# Patient Record
Sex: Female | Born: 1985
Health system: Southern US, Community
[De-identification: ages and names within clinical notes are randomized; demographics above are authoritative.]

## PROBLEM LIST (undated history)

## (undated) DIAGNOSIS — J45909 Unspecified asthma, uncomplicated: Secondary | ICD-10-CM

---

## 2018-06-14 ENCOUNTER — Emergency Department
Admission: EM | Admit: 2018-06-14 | Discharge: 2018-06-15 | Disposition: A | Discharge status: Home / Self Care | Payer: BC Managed Care – PPO | Attending: Obstetrics and Gynecology | Admitting: Obstetrics and Gynecology

## 2018-06-14 ENCOUNTER — Encounter: Payer: Self-pay | Admitting: Emergency Medicine

## 2018-06-14 ENCOUNTER — Other Ambulatory Visit: Payer: Self-pay

## 2018-06-14 DIAGNOSIS — R102 Pelvic and perineal pain: Secondary | ICD-10-CM | POA: Insufficient documentation

## 2018-06-14 DIAGNOSIS — E876 Hypokalemia: Secondary | ICD-10-CM | POA: Diagnosis not present

## 2018-06-14 DIAGNOSIS — N9489 Other specified conditions associated with female genital organs and menstrual cycle: Secondary | ICD-10-CM | POA: Diagnosis present

## 2018-06-14 DIAGNOSIS — N838 Other noninflammatory disorders of ovary, fallopian tube and broad ligament: Secondary | ICD-10-CM | POA: Insufficient documentation

## 2018-06-14 DIAGNOSIS — N83512 Torsion of left ovary and ovarian pedicle: Secondary | ICD-10-CM | POA: Insufficient documentation

## 2018-06-14 LAB — COMPREHENSIVE METABOLIC PANEL
ALT: 14 U/L (ref 0–44)
AST: 21 U/L (ref 15–41)
Albumin: 4.2 g/dL (ref 3.5–5.0)
Alkaline Phosphatase: 35 U/L — ABNORMAL LOW (ref 38–126)
Anion gap: 13 (ref 5–15)
BUN: 12 mg/dL (ref 6–20)
CO2: 17 mmol/L — ABNORMAL LOW (ref 22–32)
Calcium: 9.2 mg/dL (ref 8.9–10.3)
Chloride: 106 mmol/L (ref 98–111)
Creatinine, Ser: 0.78 mg/dL (ref 0.44–1.00)
GFR calc Af Amer: >60 mL/min (ref >60)
GFR calc non Af Amer: >60 mL/min (ref >60)
Glucose, Bld: 162 mg/dL — ABNORMAL HIGH (ref 70–99)
Potassium: 2.9 mmol/L — ABNORMAL LOW (ref 3.5–5.1)
Sodium: 136 mmol/L (ref 135–145)
Total Bilirubin: 0.7 mg/dL (ref 0.3–1.2)
Total Protein: 7.6 g/dL (ref 6.5–8.1)

## 2018-06-14 LAB — URINALYSIS, COMPLETE (UACMP) WITH MICROSCOPIC
BILIRUBIN URINE: NEGATIVE
Glucose, UA: NEGATIVE mg/dL
Ketones, ur: 20 mg/dL — AB
Leukocytes,Ua: NEGATIVE
Nitrite: NEGATIVE
Protein, ur: NEGATIVE mg/dL
Specific Gravity, Urine: 1.014 (ref 1.005–1.030)
pH: 9 — ABNORMAL HIGH (ref 5.0–8.0)

## 2018-06-14 LAB — CBC
HCT: 40.4 % (ref 36.0–46.0)
Hemoglobin: 13.9 g/dL (ref 12.0–15.0)
MCH: 29.4 pg (ref 26.0–34.0)
MCHC: 34.4 g/dL (ref 30.0–36.0)
MCV: 85.6 fL (ref 80.0–100.0)
NRBC: 0 % (ref 0.0–0.2)
Platelets: 334 10*3/uL (ref 150–400)
RBC: 4.72 MIL/uL (ref 3.87–5.11)
RDW: 12.1 % (ref 11.5–15.5)
WBC: 8.9 10*3/uL (ref 4.0–10.5)

## 2018-06-14 LAB — LIPASE, BLOOD: Lipase: 35 U/L (ref 11–51)

## 2018-06-14 LAB — POCT PREGNANCY, URINE: Preg Test, Ur: NEGATIVE

## 2018-06-14 MED ORDER — ONDANSETRON 4 MG PO TBDP
4.0000 mg | ORAL_TABLET | Freq: Once | ORAL | Status: AC
  Administered 2018-06-14: 4 mg via ORAL
  Filled 2018-06-14: qty 1

## 2018-06-14 MED ORDER — MORPHINE SULFATE (PF) 4 MG/ML IV SOLN
4.0000 mg | Freq: Once | INTRAVENOUS | Status: AC
  Administered 2018-06-15: 4 mg via INTRAVENOUS

## 2018-06-14 MED ORDER — ONDANSETRON HCL 4 MG/2ML IJ SOLN
4.0000 mg | Freq: Once | INTRAMUSCULAR | Status: AC
  Administered 2018-06-15: 4 mg via INTRAVENOUS

## 2018-06-14 MED ORDER — OXYCODONE-ACETAMINOPHEN 5-325 MG PO TABS
1.0000 | ORAL_TABLET | Freq: Once | ORAL | Status: AC
  Administered 2018-06-14: 1 via ORAL
  Filled 2018-06-14: qty 1

## 2018-06-14 MED ORDER — SODIUM CHLORIDE 0.9% FLUSH
3.0000 mL | Freq: Once | INTRAVENOUS | Status: AC
  Administered 2018-06-15: 3 mL via INTRAVENOUS

## 2018-06-14 MED ORDER — MORPHINE SULFATE (PF) 4 MG/ML IV SOLN
INTRAVENOUS | Status: AC
  Administered 2018-06-15: 4 mg via INTRAVENOUS
  Filled 2018-06-14: qty 1

## 2018-06-14 MED ORDER — ONDANSETRON HCL 4 MG/2ML IJ SOLN
INTRAMUSCULAR | Status: AC
  Administered 2018-06-15: 4 mg via INTRAVENOUS
  Filled 2018-06-14: qty 2

## 2018-06-14 NOTE — ED Triage Notes (Signed)
Pt reports LLQ pain for about 2 hrs. Pt reports she has a history of ovarian cyst, reports she is spotting now. Pt reports she has vomited twice since 1900. Pt crying and in moderate distress in triage. Pt talks in complete sentences no distress noted.

## 2018-06-14 NOTE — ED Provider Notes (Addendum)
[facility] Emergency Department Provider Note  ____________________________________________   First MD Initiated Contact with Patient 06/14/18 2342     (approximate)  I have reviewed the triage vital signs and the nursing notes.   HISTORY  Chief Complaint Abdominal Pain    HPI Ashlee Kelley is a 33 y.o. female who reports no chronic medical issues and presents for evaluation of acute onset and severe left lower quadrant pain associated with nausea and vomiting.  She reports that she is having some intermittent pain in her lower abdomen and her back during the course of the day but did not think much about it.  The pain was dull and aching.  Then about 7:30 PM tonight she had acute onset of severe pain that is sharp and stabbing.  It is primarily in her left lower quadrant  but also radiates into and around the left flank.  She has had no dysuria noticed no blood in her urine.  She does not have regular periods due to being on birth control but she has had some pain thought to be associated with ovarian cysts in the past.  She has been having some vaginal spotting over the last few days and if she had regular periods, she believes that this would be the time she would be having one.  The pain has been constant and severe and has caused her to vomit multiple times with persistent nausea.  She denies any respiratory complaints including shortness of breath and cough.  She denies fever/chills, sore throat, chest pain.  Nothing in particular makes the symptoms better except that sometimes laying on her right side slightly helps it.  Nothing in particular makes the symptoms worse.          History reviewed. No pertinent past medical history.  There are no active problems to display for this patient.   History reviewed. No pertinent surgical history.  Prior to Admission medications   Not on File    Allergies Patient has no known allergies.  No family history  on file.  Social History Social History   Tobacco Use   Smoking status: Never Smoker  Substance Use Topics   Alcohol use: Not Currently   Drug use: Not on file    Review of Systems Constitutional: No fever/chills Eyes: No visual changes. ENT: No sore throat. Cardiovascular: Denies chest pain. Respiratory: Denies shortness of breath. Gastrointestinal: Severe pain in the left lower quadrant radiating to the back associated with nausea and vomiting. Genitourinary: Mild vaginal spotting over the last couple of days.  Negative for dysuria. Musculoskeletal: Negative for neck pain.  Back pain that seems to be associated with the left lower quadrant pain Integumentary: Negative for rash. Neurological: Negative for headaches, focal weakness or numbness.   ____________________________________________   PHYSICAL EXAM:  VITAL SIGNS: ED Triage Vitals [06/14/18 2208]  Enc Vitals Group     BP 118/82     Pulse Rate 80     Resp 20     Temp 98.5 F (36.9 C)     Temp Source Oral     SpO2 100 %     Weight 63.5 kg (140 lb)     Height 1.676 m ( )     Head Circumference      Peak Flow      Pain Score 10     Pain Loc      Pain Edu?      Excl. in GC?  Constitutional: Alert and oriented.  Appears healthy at baseline but is in severe distress at this time. Eyes: Conjunctivae are normal.  Head: Atraumatic. Nose: No congestion/rhinnorhea. Mouth/Throat: Mucous membranes are moist. Neck: No stridor.  No meningeal signs.   Cardiovascular: Normal rate, regular rhythm. Good peripheral circulation. Grossly normal heart sounds. Respiratory: Normal respiratory effort.  No retractions. Lungs CTAB. Gastrointestinal: Soft and nondistended.  Severe tenderness to palpation of the left lower quadrant with some tenderness to percussion of the left flank. Genitourinary: Deferred Musculoskeletal: No lower extremity tenderness nor edema. No gross deformities of extremities. Neurologic:   Normal speech and language. No gross focal neurologic deficits are appreciated.  Skin:  Skin is warm, dry and intact. No rash noted. Psychiatric: Mood and affect are normal. Speech and behavior are normal.  ____________________________________________   LABS (all labs ordered are listed, but only abnormal results are displayed)  Labs Reviewed  COMPREHENSIVE METABOLIC PANEL - Abnormal; Notable for the following components:      Result Value   Potassium 2.9 (*)    CO2 17 (*)    Glucose, Bld 162 (*)    Alkaline Phosphatase 35 (*)    All other components within normal limits  URINALYSIS, COMPLETE (UACMP) WITH MICROSCOPIC - Abnormal; Notable for the following components:   Color, Urine YELLOW (*)    APPearance HAZY (*)    pH 9.0 (*)    Hgb urine dipstick SMALL (*)    Ketones, ur 20 (*)    Bacteria, UA RARE (*)    All other components within normal limits  URINE CULTURE  LIPASE, BLOOD  CBC  POC URINE PREG, ED  POCT PREGNANCY, URINE  TYPE AND SCREEN   ____________________________________________  EKG  None - EKG not ordered by ED physician ____________________________________________  RADIOLOGY   ED MD interpretation: Large left adnexal mass with no venous or arterial blood flow consistent with ovarian torsion  Official radiology report(s): [place] Transvaginal Non-ob  Result Date: 06/15/2018 CLINICAL DATA:  LEFT lower quadrant pain since at 1930 hours on 06/14/2018, not pregnant, question ovarian torsion EXAM: TRANSABDOMINAL AND TRANSVAGINAL ULTRASOUND OF PELVIS DOPPLER ULTRASOUND OF OVARIES TECHNIQUE: Both transabdominal and transvaginal ultrasound examinations of the pelvis were performed. Transabdominal technique was performed for global imaging of the pelvis including uterus, ovaries, adnexal regions, and pelvic cul-de-sac. It was necessary to proceed with endovaginal exam following the transabdominal exam to visualize the endometrium and LEFT ovary, and to characterize a  cystic adnexal lesion. Color and duplex Doppler ultrasound was utilized to evaluate blood flow to the ovaries. COMPARISON:  None. FINDINGS: Uterus Measurements: 7.5 x 3.5 x 3.2 cm = volume: 44 mL. Anteverted. Normal morphology without mass. Endometrium Thickness: 4 mm, normal.  No endometrial fluid or focal abnormality Right ovary Measurements: 2.2 x 1.8 x 1.6 cm = volume: 3.3 mL. Normal morphology without mass. Internal blood flow present on color Doppler imaging. Left ovary Measurements: 3.0 x 2.1 x 3.0 cm = volume: 9.7 mL. No mass lesion identified. Heterogeneous echogenicity. Large adjacent cystic lesion question arising from LEFT ovary versus paraovarian, 7.0 x 4.4 x 8.8 cm, containing minimal scattered internal echogenicity. No blood flow identified within LEFT ovary on color Doppler imaging. Pulsed Doppler evaluation of both ovaries demonstrates normal low-resistance arterial and venous waveforms in the RIGHT ovary. No arterial or venous waveforms are detected within the LEFT ovary. Other findings Small amount of free pelvic fluid.  No additional adnexal masses. IMPRESSION: Unremarkable uterus and RIGHT ovary. Large minimally complicated cystic lesion  in LEFT adnexa adjacent to the LEFT ovary, with visualized LEFT ovarian tissue abnormal and heterogeneous in appearance. No internal blood flow is seen within the LEFT ovary on color Doppler imaging, nor arterial/waveforms detected on pulsed Doppler imaging, consistent with LEFT ovarian torsion. Critical Value/emergent results were called by telephone at the time of interpretation on 06/15/2018 at 0100 hours to Dr. Loleta Rose , who verbally acknowledged these results. Electronically Signed   By: Ulyses Southward M.D.   On: 06/15/2018 01:02   US Pelvis Complete  Result Date: 06/15/2018 CLINICAL DATA:  LEFT lower quadrant pain since at 1930 hours on 06/14/2018, not pregnant, question ovarian torsion EXAM: TRANSABDOMINAL AND TRANSVAGINAL ULTRASOUND OF PELVIS  DOPPLER ULTRASOUND OF OVARIES TECHNIQUE: Both transabdominal and transvaginal ultrasound examinations of the pelvis were performed. Transabdominal technique was performed for global imaging of the pelvis including uterus, ovaries, adnexal regions, and pelvic cul-de-sac. It was necessary to proceed with endovaginal exam following the transabdominal exam to visualize the endometrium and LEFT ovary, and to characterize a cystic adnexal lesion. Color and duplex Doppler ultrasound was utilized to evaluate blood flow to the ovaries. COMPARISON:  None. FINDINGS: Uterus Measurements: 7.5 x 3.5 x 3.2 cm = volume: 44 mL. Anteverted. Normal morphology without mass. Endometrium Thickness: 4 mm, normal.  No endometrial fluid or focal abnormality Right ovary Measurements: 2.2 x 1.8 x 1.6 cm = volume: 3.3 mL. Normal morphology without mass. Internal blood flow present on color Doppler imaging. Left ovary Measurements: 3.0 x 2.1 x 3.0 cm = volume: 9.7 mL. No mass lesion identified. Heterogeneous echogenicity. Large adjacent cystic lesion question arising from LEFT ovary versus paraovarian, 7.0 x 4.4 x 8.8 cm, containing minimal scattered internal echogenicity. No blood flow identified within LEFT ovary on color Doppler imaging. Pulsed Doppler evaluation of both ovaries demonstrates normal low-resistance arterial and venous waveforms in the RIGHT ovary. No arterial or venous waveforms are detected within the LEFT ovary. Other findings Small amount of free pelvic fluid.  No additional adnexal masses. IMPRESSION: Unremarkable uterus and RIGHT ovary. Large minimally complicated cystic lesion in LEFT adnexa adjacent to the LEFT ovary, with visualized LEFT ovarian tissue abnormal and heterogeneous in appearance. No internal blood flow is seen within the LEFT ovary on color Doppler imaging, nor arterial/waveforms detected on pulsed Doppler imaging, consistent with LEFT ovarian torsion. Critical Value/emergent results were called by  telephone at the time of interpretation on 06/15/2018 at 0100 hours to Dr. Loleta Rose , who verbally acknowledged these results. Electronically Signed   By: Ulyses Southward M.D.   On: 06/15/2018 01:02   [place] Art/ven Flow Abd Pelv Doppler  Result Date: 06/15/2018 CLINICAL DATA:  LEFT lower quadrant pain since at 1930 hours on 06/14/2018, not pregnant, question ovarian torsion EXAM: TRANSABDOMINAL AND TRANSVAGINAL ULTRASOUND OF PELVIS DOPPLER ULTRASOUND OF OVARIES TECHNIQUE: Both transabdominal and transvaginal ultrasound examinations of the pelvis were performed. Transabdominal technique was performed for global imaging of the pelvis including uterus, ovaries, adnexal regions, and pelvic cul-de-sac. It was necessary to proceed with endovaginal exam following the transabdominal exam to visualize the endometrium and LEFT ovary, and to characterize a cystic adnexal lesion. Color and duplex Doppler ultrasound was utilized to evaluate blood flow to the ovaries. COMPARISON:  None. FINDINGS: Uterus Measurements: 7.5 x 3.5 x 3.2 cm = volume: 44 mL. Anteverted. Normal morphology without mass. Endometrium Thickness: 4 mm, normal.  No endometrial fluid or focal abnormality Right ovary Measurements: 2.2 x 1.8 x 1.6 cm = volume: 3.3 mL. Normal  morphology without mass. Internal blood flow present on color Doppler imaging. Left ovary Measurements: 3.0 x 2.1 x 3.0 cm = volume: 9.7 mL. No mass lesion identified. Heterogeneous echogenicity. Large adjacent cystic lesion question arising from LEFT ovary versus paraovarian, 7.0 x 4.4 x 8.8 cm, containing minimal scattered internal echogenicity. No blood flow identified within LEFT ovary on color Doppler imaging. Pulsed Doppler evaluation of both ovaries demonstrates normal low-resistance arterial and venous waveforms in the RIGHT ovary. No arterial or venous waveforms are detected within the LEFT ovary. Other findings Small amount of free pelvic fluid.  No additional adnexal masses.  IMPRESSION: Unremarkable uterus and RIGHT ovary. Large minimally complicated cystic lesion in LEFT adnexa adjacent to the LEFT ovary, with visualized LEFT ovarian tissue abnormal and heterogeneous in appearance. No internal blood flow is seen within the LEFT ovary on color Doppler imaging, nor arterial/waveforms detected on pulsed Doppler imaging, consistent with LEFT ovarian torsion. Critical Value/emergent results were called by telephone at the time of interpretation on 06/15/2018 at 0100 hours to Dr. Loleta Rose , who verbally acknowledged these results. Electronically Signed   By: Ulyses Southward M.D.   On: 06/15/2018 01:02    ____________________________________________   PROCEDURES   Procedure(s) performed (including Critical Care):  Procedures   ____________________________________________   INITIAL IMPRESSION / MDM / ASSESSMENT AND PLAN / ED COURSE  As part of my medical decision making, I reviewed the following data within the electronic MEDICAL RECORD NUMBER History obtained from family, Nursing notes reviewed and incorporated, Labs reviewed , Old chart reviewed, A consult was requested and obtained from this/these consultant(s) Psychiatry and Notes from prior ED visits         Differential diagnosis includes, but is not limited to, ovarian torsion, renal colic/ureteral stone, UTI/pyelonephritis, STD/PID/TOA, less likely musculoskeletal strain.  The patient is in severe distress and I have ordered morphine 4 mg IV and Zofran 4 mg IV.  Her vital signs are stable.  Given the severity of her pain I think that is most likely she has either ovarian torsion or ureteral colic.  Her urinalysis is notable for a little bit of hemoglobin and a few white cells but very few red cells.  The ketones are consistent with her vomiting.  This does not necessarily look like a picture that would suggest a kidney stone nor pyelonephritis.  I am sending a urine culture.  Her urine pregnancy test is negative and  her conference of metabolic panel is normal except for a potassium of 2.9 which is likely secondary to gastrointestinal losses.  She is lying is still that she can in bed which is unlike atypical renal colic patient who is moving around and cannot find a position of comfort.  Given that ovarian torsion is a time sensitive surgical emergency, I will investigate this possibility first with an ultrasound with Dopplers.  I called Rosey Bath the ultrasound technologist and let her know about the urgency of the study and she will take her for a scan as soon as possible.  I explained to the patient and her husband and they understand and agree with the plan.  Clinical Course as of Jun 15 110  Sat Jun 15, 2018  0044 Type and screen [facility] [CF]  0103 I received a call from radiology.  The radiologist confirmed that there is a large, almost 9 cm left adnexal mass and there is no venous or arterial blood flow to the left ovary.  I informed the patient  who is starting to hurt again and I have also ordered Dilaudid 1 mg IV.  I have paged Dr. Dalbert Garnet who is on-call for OB/GYN.   [CF]  0105 I discussed the case with Dr. Dalbert Garnet by phone and she is coming to the emergency department to evaluate the patient and will take her emergently to the operating room.  The patient is aware.   [CF]  0108 The patient last had anything to eat or drink at about 6:30 PM, approximately 6 hours and 40 minutes ago.   [CF]  0111 Type and screen [facility] [CF]    Clinical Course User Index [CF] Loleta Rose, MD    ____________________________________________  FINAL CLINICAL IMPRESSION(S) / ED DIAGNOSES  Final diagnoses:  Torsion of left ovary and ovarian pedicle  Adnexal mass     MEDICATIONS GIVEN DURING THIS VISIT:  Medications  HYDROmorphone (DILAUDID) injection 1 mg (has no administration in time range)  sodium chloride flush (NS) 0.9 % injection 3 mL (3 mLs Intravenous  Given 06/15/18 0001)  oxyCODONE-acetaminophen (PERCOCET/ROXICET) 5-325 MG per tablet 1 tablet (1 tablet Oral Given 06/14/18 2216)  ondansetron (ZOFRAN-ODT) disintegrating tablet 4 mg (4 mg Oral Given 06/14/18 2216)  morphine 4 MG/ML injection 4 mg (4 mg Intravenous Given 06/15/18 0003)  ondansetron (ZOFRAN) injection 4 mg (4 mg Intravenous Given 06/15/18 0001)  sodium chloride 0.9 % bolus 1,000 mL (1,000 mLs Intravenous New Bag/Given 06/15/18 0049)  promethazine (PHENERGAN) injection 12.5 mg (12.5 mg Intravenous Given 06/15/18 0054)     ED Discharge Orders    None       Note:  This document was prepared using Dragon voice recognition software and may include unintentional dictation errors.   Loleta Rose, MD 06/15/18 Lenoria Chime    Loleta Rose, MD 06/15/18 David Stall    Loleta Rose, MD 06/15/18 [phone]

## 2018-06-14 NOTE — ED Notes (Signed)
ED Provider at bedside.  Pt with sig other, having period att with BC, LMP irregular  C/o LLQ pain radiating to left flank pain 1930 tonight (started with back pain last night), pt took tylenol at that time,  Some abdominal tenderness, minimal pain to flank percussion  Pt having N/V today

## 2018-06-15 ENCOUNTER — Emergency Department: Payer: BC Managed Care – PPO | Admitting: Certified Registered Nurse Anesthetist

## 2018-06-15 ENCOUNTER — Encounter
Admission: EM | Disposition: A | Discharge status: Home / Self Care | Payer: Self-pay | Source: Home / Self Care | Attending: Emergency Medicine

## 2018-06-15 ENCOUNTER — Emergency Department: Payer: BC Managed Care – PPO

## 2018-06-15 HISTORY — PX: LAPAROSCOPIC SALPINGO OOPHERECTOMY: SHX5927

## 2018-06-15 LAB — TYPE AND SCREEN
ABO/RH(D): O POS
Antibody Screen: NEGATIVE

## 2018-06-15 SURGERY — SALPINGO-OOPHORECTOMY, LAPAROSCOPIC
Anesthesia: General | Laterality: Left

## 2018-06-15 MED ORDER — SUGAMMADEX SODIUM 200 MG/2ML IV SOLN
INTRAVENOUS | Status: DC | PRN
  Administered 2018-06-15: 150 mg via INTRAVENOUS

## 2018-06-15 MED ORDER — FENTANYL CITRATE (PF) 100 MCG/2ML IJ SOLN
INTRAMUSCULAR | Status: DC | PRN
  Administered 2018-06-15: 75 ug via INTRAVENOUS

## 2018-06-15 MED ORDER — ACETAMINOPHEN 10 MG/ML IV SOLN
INTRAVENOUS | Status: AC
  Filled 2018-06-15: qty 100

## 2018-06-15 MED ORDER — BUPIVACAINE HCL 0.5 % IJ SOLN
INTRAMUSCULAR | Status: DC | PRN
  Administered 2018-06-15: 14 mL

## 2018-06-15 MED ORDER — KETOROLAC TROMETHAMINE 30 MG/ML IJ SOLN
INTRAMUSCULAR | Status: DC | PRN
  Administered 2018-06-15: 30 mg via INTRAVENOUS

## 2018-06-15 MED ORDER — ONDANSETRON HCL 4 MG/2ML IJ SOLN: 4.0000 mg | Freq: Once | INTRAMUSCULAR | Status: DC | PRN

## 2018-06-15 MED ORDER — ACETAMINOPHEN 500 MG PO TABS: 1000.0000 mg | ORAL_TABLET | Freq: Four times a day (QID) | ORAL | 0 refills | Status: AC

## 2018-06-15 MED ORDER — SUCCINYLCHOLINE CHLORIDE 20 MG/ML IJ SOLN
INTRAMUSCULAR | Status: DC | PRN
  Administered 2018-06-15: 100 mg via INTRAVENOUS

## 2018-06-15 MED ORDER — OXYCODONE HCL 5 MG PO TABS
ORAL_TABLET | ORAL | Status: AC
  Administered 2018-06-15: 5 mg
  Filled 2018-06-15: qty 1

## 2018-06-15 MED ORDER — DEXAMETHASONE SODIUM PHOSPHATE 10 MG/ML IJ SOLN
INTRAMUSCULAR | Status: AC
  Filled 2018-06-15: qty 1

## 2018-06-15 MED ORDER — PROPOFOL 10 MG/ML IV BOLUS
INTRAVENOUS | Status: AC
  Filled 2018-06-15: qty 40

## 2018-06-15 MED ORDER — PROPOFOL 10 MG/ML IV BOLUS
INTRAVENOUS | Status: DC | PRN
  Administered 2018-06-15: 150 mg via INTRAVENOUS

## 2018-06-15 MED ORDER — LIDOCAINE HCL (PF) 2 % IJ SOLN
INTRAMUSCULAR | Status: AC
  Filled 2018-06-15: qty 10

## 2018-06-15 MED ORDER — FENTANYL CITRATE (PF) 100 MCG/2ML IJ SOLN: 25.0000 ug | INTRAMUSCULAR | Status: DC | PRN

## 2018-06-15 MED ORDER — DEXAMETHASONE SODIUM PHOSPHATE 10 MG/ML IJ SOLN
INTRAMUSCULAR | Status: DC | PRN
  Administered 2018-06-15: 8 mg via INTRAVENOUS

## 2018-06-15 MED ORDER — IBUPROFEN 800 MG PO TABS: 800.0000 mg | ORAL_TABLET | Freq: Three times a day (TID) | ORAL | 1 refills | Status: AC

## 2018-06-15 MED ORDER — HYDROMORPHONE HCL 1 MG/ML IJ SOLN
1.0000 mg | INTRAMUSCULAR | Status: AC
  Administered 2018-06-15: 1 mg via INTRAVENOUS
  Filled 2018-06-15: qty 1

## 2018-06-15 MED ORDER — PROMETHAZINE HCL 25 MG/ML IJ SOLN
12.5000 mg | Freq: Once | INTRAMUSCULAR | Status: AC
  Administered 2018-06-15: 12.5 mg via INTRAVENOUS
  Filled 2018-06-15: qty 1

## 2018-06-15 MED ORDER — DOCUSATE SODIUM 100 MG PO CAPS: 100.0000 mg | ORAL_CAPSULE | Freq: Two times a day (BID) | ORAL | 0 refills | Status: AC

## 2018-06-15 MED ORDER — OXYCODONE HCL 5 MG PO CAPS: 5.0000 mg | ORAL_CAPSULE | Freq: Four times a day (QID) | ORAL | 0 refills | Status: AC | PRN

## 2018-06-15 MED ORDER — LACTATED RINGERS IV SOLN
INTRAVENOUS | Status: DC | PRN
  Administered 2018-06-15 (×2): via INTRAVENOUS

## 2018-06-15 MED ORDER — ROCURONIUM BROMIDE 50 MG/5ML IV SOLN
INTRAVENOUS | Status: AC
  Filled 2018-06-15: qty 1

## 2018-06-15 MED ORDER — ROCURONIUM BROMIDE 100 MG/10ML IV SOLN
INTRAVENOUS | Status: DC | PRN
  Administered 2018-06-15: 10 mg via INTRAVENOUS

## 2018-06-15 MED ORDER — SUGAMMADEX SODIUM 200 MG/2ML IV SOLN
INTRAVENOUS | Status: AC
  Filled 2018-06-15: qty 2

## 2018-06-15 MED ORDER — SUCCINYLCHOLINE CHLORIDE 20 MG/ML IJ SOLN
INTRAMUSCULAR | Status: AC
  Filled 2018-06-15: qty 1

## 2018-06-15 MED ORDER — MIDAZOLAM HCL 2 MG/2ML IJ SOLN
INTRAMUSCULAR | Status: DC | PRN
  Administered 2018-06-15: 1.5 mg via INTRAVENOUS

## 2018-06-15 MED ORDER — MIDAZOLAM HCL 2 MG/2ML IJ SOLN
INTRAMUSCULAR | Status: AC
  Filled 2018-06-15: qty 2

## 2018-06-15 MED ORDER — FENTANYL CITRATE (PF) 100 MCG/2ML IJ SOLN
INTRAMUSCULAR | Status: AC
  Filled 2018-06-15: qty 2

## 2018-06-15 MED ORDER — ACETAMINOPHEN 10 MG/ML IV SOLN
INTRAVENOUS | Status: DC | PRN
  Administered 2018-06-15: 1000 mg via INTRAVENOUS

## 2018-06-15 MED ORDER — SODIUM CHLORIDE 0.9 % IV BOLUS
1000.0000 mL | Freq: Once | INTRAVENOUS | Status: AC
  Administered 2018-06-15: 1000 mL via INTRAVENOUS

## 2018-06-15 MED ORDER — ONDANSETRON HCL 4 MG/2ML IJ SOLN
INTRAMUSCULAR | Status: AC
  Filled 2018-06-15: qty 2

## 2018-06-15 MED ORDER — BUPIVACAINE HCL (PF) 0.5 % IJ SOLN
INTRAMUSCULAR | Status: AC
  Filled 2018-06-15: qty 30

## 2018-06-15 SURGICAL SUPPLY — 48 items
BAG URINE DRAINAGE (UROLOGICAL SUPPLIES) ×3 IMPLANT
BLADE SURG SZ11 CARB STEEL (BLADE) ×3 IMPLANT
CATH FOLEY 2WAY  5CC 16FR (CATHETERS) ×2
CATH URTH 16FR FL 2W BLN LF (CATHETERS) ×1 IMPLANT
CHLORAPREP W/TINT 26 (MISCELLANEOUS) ×3 IMPLANT
CLOSURE WOUND 1/4X4 (GAUZE/BANDAGES/DRESSINGS)
COVER WAND RF STERILE (DRAPES) IMPLANT
DECANTER SPIKE VIAL GLASS SM (MISCELLANEOUS) ×3 IMPLANT
DERMABOND ADVANCED (GAUZE/BANDAGES/DRESSINGS) ×2
DERMABOND ADVANCED .7 DNX12 (GAUZE/BANDAGES/DRESSINGS) ×1 IMPLANT
DRAPE GENERAL ENDO 106X123.5 (DRAPES) ×3 IMPLANT
DRAPE LEGGINS SURG 28X43 STRL (DRAPES) ×3 IMPLANT
DRAPE UNDER BUTTOCK W/FLU (DRAPES) ×3 IMPLANT
GLOVE BIO SURGEON STRL SZ7 (GLOVE) ×9 IMPLANT
GLOVE INDICATOR 7.5 STRL GRN (GLOVE) ×6 IMPLANT
GOWN STRL REUS W/ TWL LRG LVL3 (GOWN DISPOSABLE) ×2 IMPLANT
GOWN STRL REUS W/TWL LRG LVL3 (GOWN DISPOSABLE) ×4
GRASPER SUT TROCAR 14GX15 (MISCELLANEOUS) ×3 IMPLANT
IRRIGATION STRYKERFLOW (MISCELLANEOUS) ×1 IMPLANT
IRRIGATOR STRYKERFLOW (MISCELLANEOUS) ×3
IV NS 1000ML (IV SOLUTION) ×2
IV NS 1000ML BAXH (IV SOLUTION) ×1 IMPLANT
KIT PINK PAD W/HEAD ARE REST (MISCELLANEOUS) ×3
KIT PINK PAD W/HEAD ARM REST (MISCELLANEOUS) ×1 IMPLANT
KIT TURNOVER CYSTO (KITS) ×3 IMPLANT
LABEL OR SOLS (LABEL) ×3 IMPLANT
LIGASURE LAP MARYLAND 5MM 37CM (ELECTROSURGICAL) ×3 IMPLANT
NEEDLE FILTER BLUNT 18X 1/2SAF (NEEDLE) ×2
NEEDLE FILTER BLUNT 18X1 1/2 (NEEDLE) ×1 IMPLANT
NS IRRIG 500ML POUR BTL (IV SOLUTION) ×3 IMPLANT
PACK GYN LAPAROSCOPIC (MISCELLANEOUS) ×3 IMPLANT
PAD OB MATERNITY 4.3X12.25 (PERSONAL CARE ITEMS) ×3 IMPLANT
PAD PREP 24X41 OB/GYN DISP (PERSONAL CARE ITEMS) ×3 IMPLANT
POUCH SPECIMEN RETRIEVAL 10MM (ENDOMECHANICALS) IMPLANT
SCISSORS METZENBAUM CVD 33 (INSTRUMENTS) IMPLANT
SET TUBE SMOKE EVAC HIGH FLOW (TUBING) ×3 IMPLANT
SLEEVE ENDOPATH XCEL 5M (ENDOMECHANICALS) ×6 IMPLANT
STRIP CLOSURE SKIN 1/4X4 (GAUZE/BANDAGES/DRESSINGS) IMPLANT
SUT MNCRL AB 4-0 PS2 18 (SUTURE) ×3 IMPLANT
SUT VIC AB 2-0 UR6 27 (SUTURE) ×3 IMPLANT
SUT VIC AB 4-0 SH 27 (SUTURE) ×2
SUT VIC AB 4-0 SH 27XANBCTRL (SUTURE) ×1 IMPLANT
SUT VICRYL 0 AB UR-6 (SUTURE) ×3 IMPLANT
SYR 50ML LL SCALE MARK (SYRINGE) IMPLANT
SYR 5ML LL (SYRINGE) ×3 IMPLANT
TROCAR XCEL 12X100 BLDLESS (ENDOMECHANICALS) ×3 IMPLANT
TROCAR XCEL NON-BLD 5MMX100MML (ENDOMECHANICALS) ×3 IMPLANT
TUBING ART PRESS 48 MALE/FEM (TUBING) IMPLANT

## 2018-06-15 NOTE — Anesthesia Post-op Follow-up Note (Signed)
Anesthesia QCDR form completed.        

## 2018-06-15 NOTE — ED Notes (Signed)
Patient transported to Ultrasound 

## 2018-06-15 NOTE — Anesthesia Preprocedure Evaluation (Signed)
Anesthesia Evaluation  Patient identified by MRN, date of birth, ID band Patient awake    Reviewed: Allergy & Precautions, H&P , NPO status , Patient's Chart, lab work & pertinent test results, reviewed documented beta blocker date and time   Airway Mallampati: II  TM Distance: >3 FB Neck ROM: full    Dental  (+) Teeth Intact   Pulmonary neg pulmonary ROS,    Pulmonary exam normal        Cardiovascular Exercise Tolerance: Good negative cardio ROS Normal cardiovascular exam Rhythm:regular Rate:Normal     Neuro/Psych negative neurological ROS  negative psych ROS   GI/Hepatic negative GI ROS, Neg liver ROS,   Endo/Other  negative endocrine ROS  Renal/GU negative Renal ROS  negative genitourinary   Musculoskeletal   Abdominal   Peds  Hematology negative hematology ROS (+)   Anesthesia Other Findings History reviewed. No pertinent past medical history. History reviewed. No pertinent surgical history. BMI    Body Mass Index:  22.60 kg/m     Reproductive/Obstetrics negative OB ROS                             Anesthesia Physical Anesthesia Plan  ASA: II and emergent  Anesthesia Plan: General ETT   Post-op Pain Management:    Induction:   PONV Risk Score and Plan:   Airway Management Planned:   Additional Equipment:   Intra-op Plan:   Post-operative Plan:   Informed Consent: I have reviewed the patients History and Physical, chart, labs and discussed the procedure including the risks, benefits and alternatives for the proposed anesthesia with the patient or authorized representative who has indicated his/her understanding and acceptance.     Dental Advisory Given  Plan Discussed with: CRNA  Anesthesia Plan Comments:         Anesthesia Quick Evaluation

## 2018-06-15 NOTE — Op Note (Signed)
Ashlee Kelley PROCEDURE DATE: 06/15/2018  PREOPERATIVE DIAGNOSIS: Acute left sided pelvic pain, left adnexal cyst, suspected ovarian torsion POSTOPERATIVE DIAGNOSIS: Left paratubal cyst, left ovarian torsion PROCEDURE:  - Exam under anesthesia - Laparoscopic reduction of ovarian torsion - left paratubal cystectomy - left salpingectomy  SURGEON:  Dr. Christeen Douglas ASSISTANT: CST ANESTHESIOLOGIST: Yevette Edwards, MD Anesthesiologist: Yevette Edwards, MD CRNA: Henrietta Hoover, CRNA  INDICATIONS: 33 y.o. G0 with history of 6 hours of severe acute onset pelvic pain with suspected ovarian torsion and a large left adnexal mass, desiring surgical evaluation.   Please see preoperative notes for further details.  Risks of surgery were discussed with the patient including but not limited to: bleeding which may require transfusion or reoperation; infection which may require antibiotics; injury to bowel, bladder, ureters or other surrounding organs; need for additional procedures including laparotomy; thromboembolic phenomenon, incisional problems and other postoperative/anesthesia complications. Written informed consent was obtained.    FINDINGS:  Small uterus, normal right ovary and fallopian tube.  On her left, there was a large simple cyst, surrounding the left fallopian tube, with the fimbriated end tacked and abnormally twisted into the cyst.  The left fallopian tube and cyst were purple and indurated, with a small amount of hemoperitoneum and dark ascites in the posterior cul-de-sac.  The left ovary was twisted 3 times around its pedicle, and purple. No other abdominal/pelvic abnormality.  Normal upper abdomen.  Normal appendix.  ANESTHESIA:    General INTRAVENOUS FLUIDS: 900 ml ESTIMATED BLOOD LOSS: 5 ml URINE OUTPUT: 250 ml SPECIMENS: left tube and cyst COMPLICATIONS: None immediate  PROCEDURE IN DETAIL:  The patient had sequential compression devices applied to her lower extremities while in the  preoperative area.  She was then taken to the operating room where general anesthesia was administered and was found to be adequate.  She was placed in the dorsal lithotomy position, and was prepped and draped in a sterile manner.  A Foley catheter was inserted into her bladder and attached to constant drainage and a uterine manipulator was then advanced into the uterus .  After an adequate timeout was performed, attention was turned to the abdomen where an umbilical incision was made with the scalpel.  The Optiview 5-mm trocar and sleeve were then advanced without difficulty with the laparoscope under direct visualization into the abdomen.  The abdomen was then insufflated with carbon dioxide gas and adequate pneumoperitoneum was obtained.   A detailed survey of the patient's pelvis and abdomen revealed the findings as mentioned above.  An additional 61mm trocar was placed in the right lower quadrant under direct visualization, and at 64mm trocar was placed in the opposite quadrant under direct visualization.   Evaluation of the pelvic sidewalls to observe the course of the ureters was undertaken, assuring the ureter was outside the operative field.   Hemoperitoneum was evacuated, and the left adnexa was untwisted.  The ovary did bleed bright red, and evaluation of the left tube revealed tubal distortion throughout the fimbriated end.  The cyst was drained, for a large amount of clear straw-colored fluid.  However, after drainage, it was clear that the left tube had been distorted and the fimbria a ruptured along the length of the cyst.  For this reason, the left tube as well as the cyst was removed using LigaSure cautery.  The operative site was surveyed, and it was found to be hemostatic.  No intraoperative injury to surrounding organs was noted. The fascia of the 10 mm  trocar site was closed with 0 Vicryl.   Pictures were taken of the quadrants and pelvis. The abdomen was desufflated and all instruments  were then removed from the patient's abdomen. The uterine manipulator was removed without complications.  All incisions were closed with 4-0 Vicryl and Dermabond.   The patient tolerated the procedures well.  All instruments, needles, and sponge counts were correct x 2. The patient was taken to the recovery room in stable condition.

## 2018-06-15 NOTE — Anesthesia Procedure Notes (Signed)
Procedure Name: Intubation Date/Time: 06/15/2018 2:29 AM Performed by: Henrietta Hoover, CRNA Pre-anesthesia Checklist: Patient identified, Emergency Drugs available, Suction available, Patient being monitored and Timeout performed Patient Re-evaluated:Patient Re-evaluated prior to induction Oxygen Delivery Method: Circle system utilized Preoxygenation: Pre-oxygenation with 100% oxygen Induction Type: IV induction, Cricoid Pressure applied and Rapid sequence Laryngoscope Size: 3 and McGraph Grade View: Grade I Tube type: Oral Tube size: 7.0 mm Number of attempts: 1 Airway Equipment and Method: Stylet and Video-laryngoscopy Placement Confirmation: ETT inserted through vocal cords under direct vision,  positive ETCO2 and breath sounds checked- equal and bilateral Secured at: 21 cm Tube secured with: Tape Dental Injury: Teeth and Oropharynx as per pre-operative assessment  Comments: RSI - No ventilation attempted

## 2018-06-15 NOTE — ED Notes (Signed)
Dr Malena Edman at bedside

## 2018-06-15 NOTE — Consult Note (Signed)
Consult History and Physical   SERVICE: Gynecology   Patient Name: Ashlee Kelley Patient MRN:   [id number]  CC: Acute left pelvic pain  HPI: Ashlee Kelley is a 33 y.o. G0 with sudden onset acute left lower quadrant pain 5hrs ago associated with nausea/vomiting and back pain.  Ultrasound with 9cm ovarian/paraovarian cyst without blood flow to the left ovary.  No prior pelvic surgeries or pregnancies. S/P WTE without bleeding or infectious complication.  On combined OCPs, extended cycle  Review of Systems: positives in bold GEN:   fevers, chills, weight changes, appetite changes, fatigue, night sweats HEENT:  HA, vision changes, hearing loss, congestion, rhinorrhea, sinus pressure, dysphagia CV:   CP, palpitations PULM:  SOB, cough GI:  abd pain, N/V/D/C GU:  dysuria, urgency, frequency MSK:  arthralgias, myalgias, back pain, swelling SKIN:  rashes, color changes, pallor NEURO:  numbness, weakness, tingling, seizures, dizziness, tremors PSYCH:  depression, anxiety, behavioral problems, confusion  HEME/LYMPH:  easy bruising or bleeding ENDO:  heat/cold intolerance  Past Obstetrical History: OB History   No obstetric history on file.     Past Gynecologic History: Patient's last menstrual period was 06/14/2018 (approximate). Menstrual frequency Q 4 wks lasting 3 days  Past Medical History: History reviewed. No pertinent past medical history.  Past Surgical History:  History reviewed. No pertinent surgical history.  Family History:  family history is not on file.  Social History:  Social History   Socioeconomic History  . Marital status: Significant Other    Spouse name: Not on file  . Number of children: Not on file  . Years of education: Not on file  . Highest education level: Not on file  Occupational History  . Not on file  Social Needs  . Financial resource strain: Not on file  . Food insecurity:    Worry: Not on file    Inability: Not on file  .  Transportation needs:    Medical: Not on file    Non-medical: Not on file  Tobacco Use  . Smoking status: Never Smoker  Substance and Sexual Activity  . Alcohol use: Not Currently  . Drug use: Not on file  . Sexual activity: Not on file  Lifestyle  . Physical activity:    Days per week: Not on file    Minutes per session: Not on file  . Stress: Not on file  Relationships  . Social connections:    Talks on phone: Not on file    Gets together: Not on file    Attends religious service: Not on file    Active member of club or organization: Not on file    Attends meetings of clubs or organizations: Not on file    Relationship status: Not on file  . Intimate partner violence:    Fear of current or ex partner: Not on file    Emotionally abused: Not on file    Physically abused: Not on file    Forced sexual activity: Not on file  Other Topics Concern  . Not on file  Social History Narrative  . Not on file    Home Medications:  Medications reconciled in EPIC  No current facility-administered medications on file prior to encounter.    No current outpatient medications on file prior to encounter.    Allergies:  No Known Allergies  Physical Exam:  Temp:  [98.5 F (36.9 C)] 98.5 F (36.9 C) (03/20 2208) Pulse Rate:  [66-94] 66 (03/21 0130) Resp:  [18-24] 18 (03/21 0130)  BP: (103-121)/(68-82) 117/70 (03/21 0130) SpO2:  [100 %] 100 % (03/21 0130) Weight:  [63.5 kg] 63.5 kg (03/20 2208)   General Appearance:  Well developed, well nourished, no acute distress, alert and oriented x3 HEENT:  Normocephalic atraumatic, extraocular movements intact, moist mucous membranes Cardiovascular:  Normal S1/S2, regular rate and rhythm, no murmurs Pulmonary:  clear to auscultation, no wheezes, rales or rhonchi, symmetric air entry, good air exchange Abdomen:  Bowel sounds present, soft, diffusely tender, nondistended, +voluntary guarding, no CVA tenderness. +rovsing's sign Extremities:   Full range of motion, no pedal edema, 2+ distal pulses, no tenderness Skin:  normal coloration and turgor, no rashes, no suspicious skin lesions noted  Neurologic:  Cranial nerves 2-12 grossly intact, normal muscle tone, strength 5/5 all four extremities Psychiatric:  Normal mood and affect, appropriate, no AH/VH Pelvic:  deferred   Labs/Studies:   CBC and Coags:  Lab Results  Component Value Date   WBC 8.9 06/14/2018   HGB 13.9 06/14/2018   HCT 40.4 06/14/2018   MCV 85.6 06/14/2018   PLT 334 06/14/2018   CMP:  Lab Results  Component Value Date   NA 136 06/14/2018   K 2.9 (L) 06/14/2018   CL 106 06/14/2018   CO2 17 (L) 06/14/2018   BUN 12 06/14/2018   CREATININE 0.78 06/14/2018   PROT 7.6 06/14/2018   BILITOT 0.7 06/14/2018   ALT 14 06/14/2018   AST 21 06/14/2018   ALKPHOS 35 (L) 06/14/2018    Other Imaging: US Transvaginal Non-ob  Result Date: 06/15/2018 CLINICAL DATA:  LEFT lower quadrant pain since at 1930 hours on 06/14/2018, not pregnant, question ovarian torsion EXAM: TRANSABDOMINAL AND TRANSVAGINAL ULTRASOUND OF PELVIS DOPPLER ULTRASOUND OF OVARIES TECHNIQUE: Both transabdominal and transvaginal ultrasound examinations of the pelvis were performed. Transabdominal technique was performed for global imaging of the pelvis including uterus, ovaries, adnexal regions, and pelvic cul-de-sac. It was necessary to proceed with endovaginal exam following the transabdominal exam to visualize the endometrium and LEFT ovary, and to characterize a cystic adnexal lesion. Color and duplex Doppler ultrasound was utilized to evaluate blood flow to the ovaries. COMPARISON:  None. FINDINGS: Uterus Measurements: 7.5 x 3.5 x 3.2 cm = volume: 44 mL. Anteverted. Normal morphology without mass. Endometrium Thickness: 4 mm, normal.  No endometrial fluid or focal abnormality Right ovary Measurements: 2.2 x 1.8 x 1.6 cm = volume: 3.3 mL. Normal morphology without mass. Internal blood flow present on  color Doppler imaging. Left ovary Measurements: 3.0 x 2.1 x 3.0 cm = volume: 9.7 mL. No mass lesion identified. Heterogeneous echogenicity. Large adjacent cystic lesion question arising from LEFT ovary versus paraovarian, 7.0 x 4.4 x 8.8 cm, containing minimal scattered internal echogenicity. No blood flow identified within LEFT ovary on color Doppler imaging. Pulsed Doppler evaluation of both ovaries demonstrates normal low-resistance arterial and venous waveforms in the RIGHT ovary. No arterial or venous waveforms are detected within the LEFT ovary. Other findings Small amount of free pelvic fluid.  No additional adnexal masses. IMPRESSION: Unremarkable uterus and RIGHT ovary. Large minimally complicated cystic lesion in LEFT adnexa adjacent to the LEFT ovary, with visualized LEFT ovarian tissue abnormal and heterogeneous in appearance. No internal blood flow is seen within the LEFT ovary on color Doppler imaging, nor arterial/waveforms detected on pulsed Doppler imaging, consistent with LEFT ovarian torsion. Critical Value/emergent results were called by telephone at the time of interpretation on 06/15/2018 at 0100 hours to Dr. Loleta Rose , who verbally acknowledged these results. Electronically Signed  By: Ulyses Southward M.D.   On: 06/15/2018 01:02   US Pelvis Complete  Result Date: 06/15/2018 CLINICAL DATA:  LEFT lower quadrant pain since at 1930 hours on 06/14/2018, not pregnant, question ovarian torsion EXAM: TRANSABDOMINAL AND TRANSVAGINAL ULTRASOUND OF PELVIS DOPPLER ULTRASOUND OF OVARIES TECHNIQUE: Both transabdominal and transvaginal ultrasound examinations of the pelvis were performed. Transabdominal technique was performed for global imaging of the pelvis including uterus, ovaries, adnexal regions, and pelvic cul-de-sac. It was necessary to proceed with endovaginal exam following the transabdominal exam to visualize the endometrium and LEFT ovary, and to characterize a cystic adnexal lesion. Color  and duplex Doppler ultrasound was utilized to evaluate blood flow to the ovaries. COMPARISON:  None. FINDINGS: Uterus Measurements: 7.5 x 3.5 x 3.2 cm = volume: 44 mL. Anteverted. Normal morphology without mass. Endometrium Thickness: 4 mm, normal.  No endometrial fluid or focal abnormality Right ovary Measurements: 2.2 x 1.8 x 1.6 cm = volume: 3.3 mL. Normal morphology without mass. Internal blood flow present on color Doppler imaging. Left ovary Measurements: 3.0 x 2.1 x 3.0 cm = volume: 9.7 mL. No mass lesion identified. Heterogeneous echogenicity. Large adjacent cystic lesion question arising from LEFT ovary versus paraovarian, 7.0 x 4.4 x 8.8 cm, containing minimal scattered internal echogenicity. No blood flow identified within LEFT ovary on color Doppler imaging. Pulsed Doppler evaluation of both ovaries demonstrates normal low-resistance arterial and venous waveforms in the RIGHT ovary. No arterial or venous waveforms are detected within the LEFT ovary. Other findings Small amount of free pelvic fluid.  No additional adnexal masses. IMPRESSION: Unremarkable uterus and RIGHT ovary. Large minimally complicated cystic lesion in LEFT adnexa adjacent to the LEFT ovary, with visualized LEFT ovarian tissue abnormal and heterogeneous in appearance. No internal blood flow is seen within the LEFT ovary on color Doppler imaging, nor arterial/waveforms detected on pulsed Doppler imaging, consistent with LEFT ovarian torsion. Critical Value/emergent results were called by telephone at the time of interpretation on 06/15/2018 at 0100 hours to Dr. Loleta Rose , who verbally acknowledged these results. Electronically Signed   By: Ulyses Southward M.D.   On: 06/15/2018 01:02   [place] Art/ven Flow Abd Pelv Doppler  Result Date: 06/15/2018 CLINICAL DATA:  LEFT lower quadrant pain since at 1930 hours on 06/14/2018, not pregnant, question ovarian torsion EXAM: TRANSABDOMINAL AND TRANSVAGINAL ULTRASOUND OF PELVIS DOPPLER ULTRASOUND OF  OVARIES TECHNIQUE: Both transabdominal and transvaginal ultrasound examinations of the pelvis were performed. Transabdominal technique was performed for global imaging of the pelvis including uterus, ovaries, adnexal regions, and pelvic cul-de-sac. It was necessary to proceed with endovaginal exam following the transabdominal exam to visualize the endometrium and LEFT ovary, and to characterize a cystic adnexal lesion. Color and duplex Doppler ultrasound was utilized to evaluate blood flow to the ovaries. COMPARISON:  None. FINDINGS: Uterus Measurements: 7.5 x 3.5 x 3.2 cm = volume: 44 mL. Anteverted. Normal morphology without mass. Endometrium Thickness: 4 mm, normal.  No endometrial fluid or focal abnormality Right ovary Measurements: 2.2 x 1.8 x 1.6 cm = volume: 3.3 mL. Normal morphology without mass. Internal blood flow present on color Doppler imaging. Left ovary Measurements: 3.0 x 2.1 x 3.0 cm = volume: 9.7 mL. No mass lesion identified. Heterogeneous echogenicity. Large adjacent cystic lesion question arising from LEFT ovary versus paraovarian, 7.0 x 4.4 x 8.8 cm, containing minimal scattered internal echogenicity. No blood flow identified within LEFT ovary on color Doppler imaging. Pulsed Doppler evaluation of both ovaries demonstrates normal low-resistance arterial and venous  waveforms in the RIGHT ovary. No arterial or venous waveforms are detected within the LEFT ovary. Other findings Small amount of free pelvic fluid.  No additional adnexal masses. IMPRESSION: Unremarkable uterus and RIGHT ovary. Large minimally complicated cystic lesion in LEFT adnexa adjacent to the LEFT ovary, with visualized LEFT ovarian tissue abnormal and heterogeneous in appearance. No internal blood flow is seen within the LEFT ovary on color Doppler imaging, nor arterial/waveforms detected on pulsed Doppler imaging, consistent with LEFT ovarian torsion. Critical Value/emergent results were called by telephone at the time of  interpretation on 06/15/2018 at 0100 hours to Dr. Loleta Rose , who verbally acknowledged these results. Electronically Signed   By: Ulyses Southward M.D.   On: 06/15/2018 01:02     Assessment / Plan:   Briah Kelley is a 34 y.o. who presents with LEFT ovarian torsion and large left ovarian cyst. Last ate 8 hrs ago. N/V  1. Hypokalemia: repletion ordered 2. Plan for dx lap and ovarian cystectomy.  Risks of surgery including bleeding which may require transfusion or reoperation, infection, injury to bowel or other surrounding organs, need for additional procedures including laparotomy were explained to patient and written informed consent was obtained.  Anesthesia and OR aware.  ERAS and SCDs ordered on call to the OR.  To OR when ready.

## 2018-06-15 NOTE — Discharge Instructions (Signed)
Laparoscopic Ovarian Surgery Discharge Instructions  For the next three days, take ibuprofen and acetaminophen on a schedule, every 8 hours. You can take them together or you can intersperse them, and take one every four hours.You also have a narcotic, oxycodone, to take as needed if the above medicines don't help.  Postop constipation is a major cause of pain. Stay well hydrated, walk as you tolerate, and take over the counter senna as well as stool softeners if you need them.   RISKS AND COMPLICATIONS   Infection.  Bleeding.  Injury to surrounding organs.  Anesthetic side effects.   PROCEDURE   You may be given a medicine to help you relax (sedative) before the procedure. You will be given a medicine to make you sleep (general anesthetic) during the procedure.  A tube will be put down your throat to help your breath while under general anesthesia.  Several small cuts (incisions) are made in the lower abdominal area and one incision is made near the belly button.  Your abdominal area will be inflated with a safe gas (carbon dioxide). This helps give the surgeon room to operate, visualize, and helps the surgeon avoid other organs.  A thin, lighted tube (laparoscope) with a camera attached is inserted into your abdomen through the incision near the belly button. Other small instruments may also be inserted through other abdominal incisions.  The ovary is located and are removed.  After the ovary is removed, the gas is released from the abdomen.  The incisions will be closed with stitches (sutures), and Dermabond. A bandage may be placed over the incisions.  AFTER THE PROCEDURE   You will also have some mild abdominal discomfort for 3-7 days. You will be given pain medicine to ease any discomfort.  As long as there are no problems, you may be allowed to go home. Someone will need to drive you home and be with you for at least 24 hours once home.  You may have some mild  discomfort in the throat. This is from the tube placed in your throat while you were sleeping.  You may experience discomfort in the shoulder area from some trapped air between the liver and diaphragm. This sensation is normal and will slowly go away on its own.  HOME CARE INSTRUCTIONS   Take all medicines as directed.  Only take over-the-counter or prescription medicines for pain, discomfort, or fever as directed by your caregiver.  Resume daily activities as directed.  Showers are preferred over baths for 2 weeks.  You may resume sexual activities in 1 week or as you feel you would like to.  Do not drive while taking narcotics.  SEEK MEDICAL CARE IF: .  There is increasing abdominal pain.  You feel lightheaded or faint.  You have the chills.  You have an oral temperature above 102 F (38.9 C).  There is pus-like (purulent) drainage from any of the wounds.  You are unable to pass gas or have a bowel movement.  You feel sick to your stomach (nauseous) or throw up (vomit) and can't control it with your medicines.  MAKE SURE YOU:   Understand these instructions.  Will watch your condition.  Will get help right away if you are not doing well or get worse.  ExitCare Patient Information 2013 [place], [place].         AMBULATORY SURGERY  DISCHARGE INSTRUCTIONS   1) The drugs that you were given will stay in your system until tomorrow so for  the next 24 hours you should not:  A) Drive an automobile B) Make any legal decisions C) Drink any alcoholic beverage   2) You may resume regular meals tomorrow.  Today it is better to start with liquids and gradually work up to solid foods.  You may eat anything you prefer, but it is better to start with liquids, then soup and crackers, and gradually work up to solid foods.   3) Please notify your doctor immediately if you have any unusual bleeding, trouble breathing, redness and pain at the surgery site, drainage,  fever, or pain not relieved by medication.    4) Additional Instructions:        Please contact your physician with any problems or Same Day Surgery at [phone], Monday through Friday 6 am to 4 pm, or [place] at [facility] number at [phone].

## 2018-06-15 NOTE — Transfer of Care (Signed)
Immediate Anesthesia Transfer of Care Note  Patient: Ashlee Kelley  Procedure(s) Performed: LAPAROSCOPIC left SALPINgectomy, left ovarian cystectomy (Left )  Patient Location: PACU  Anesthesia Type:General  Level of Consciousness: awake, drowsy and patient cooperative  Airway & Oxygen Therapy: Patient Spontanous Breathing  Post-op Assessment: Report given to RN, Post -op Vital signs reviewed and stable and Patient moving all extremities  Post vital signs: Reviewed and stable  Last Vitals:  Vitals Value Taken Time  BP 114/70 06/15/2018  3:56 AM  Temp 36.6 C 06/15/2018  3:55 AM  Pulse 96 06/15/2018  3:59 AM  Resp 10 06/15/2018  4:00 AM  SpO2 99 % 06/15/2018  3:59 AM  Vitals shown include unvalidated device data.  Last Pain:  Vitals:   06/15/18 0140  TempSrc:   PainSc: 2          Complications: No apparent anesthesia complications

## 2018-06-16 ENCOUNTER — Encounter: Payer: Self-pay | Admitting: Obstetrics and Gynecology

## 2018-06-16 LAB — URINE CULTURE
Culture: NO GROWTH
Special Requests: NORMAL

## 2018-06-18 LAB — SURGICAL PATHOLOGY

## 2018-06-20 NOTE — Anesthesia Postprocedure Evaluation (Signed)
Anesthesia Post Note  Patient: Ashlee Kelley  Procedure(s) Performed: LAPAROSCOPIC left SALPINgectomy, left ovarian cystectomy (Left )  Patient location during evaluation: PACU Anesthesia Type: General Level of consciousness: awake and alert Pain management: pain level controlled Vital Signs Assessment: post-procedure vital signs reviewed and stable Respiratory status: spontaneous breathing, nonlabored ventilation, respiratory function stable and patient connected to nasal cannula oxygen Cardiovascular status: blood pressure returned to baseline and stable Postop Assessment: no apparent nausea or vomiting Anesthetic complications: no     Last Vitals:  Vitals:   06/15/18 0505 06/15/18 0510  BP: 113/89   Pulse: 79 77  Resp: 20 18  Temp: 37.1 C   SpO2: 99% 98%    Last Pain:  Vitals:   06/15/18 0510  TempSrc:   PainSc: 2                  Yevette Edwards

## 2019-12-11 IMAGING — US ARTERIAL AND VENOUS ULTRASOUND OF THE ABDOMEN PELVIS AND SCROTUM
1 series · 13 of 25 positions shown · non-contrast
Comparison: None.

CLINICAL DATA: LEFT lower quadrant pain since at 0686 hours on
06/14/2018, not pregnant, question ovarian torsion

EXAM:
TRANSABDOMINAL AND TRANSVAGINAL ULTRASOUND OF PELVIS
DOPPLER ULTRASOUND OF OVARIES
TECHNIQUE: Both transabdominal and transvaginal ultrasound examinations of the
pelvis were performed. Transabdominal technique was performed for
global imaging of the pelvis including uterus, ovaries, adnexal
regions, and pelvic cul-de-sac.
It was necessary to proceed with endovaginal exam following the
transabdominal exam to visualize the endometrium and LEFT ovary, and
to characterize a cystic adnexal lesion. Color and duplex Doppler
ultrasound was utilized to evaluate blood flow to the ovaries.

[Series 1: arterial and venous ultrasound of the abdomen pelv · 13 of 82 slices shown]
[im 1/82]
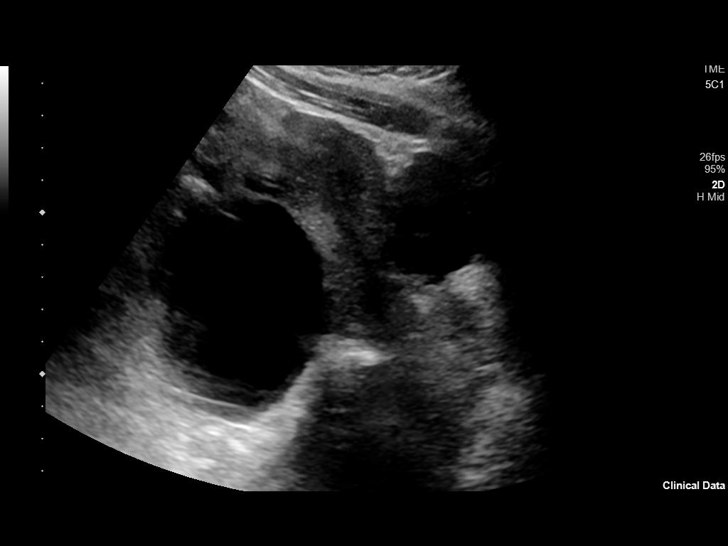
[im 7/82]
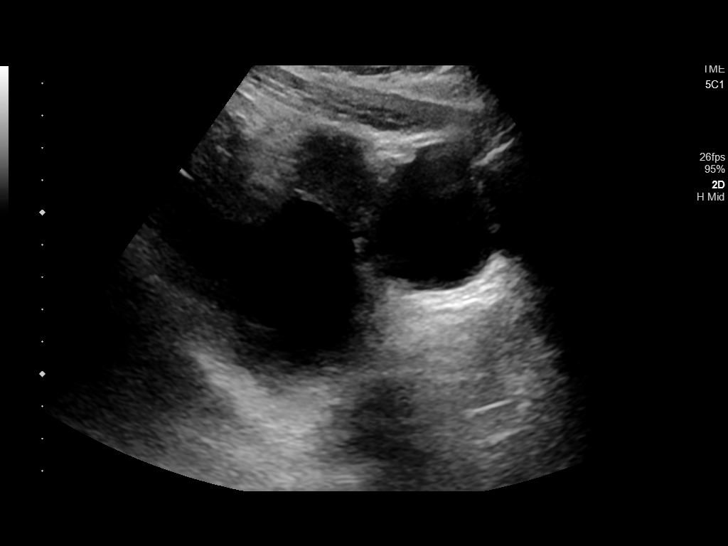
[im 14/82]
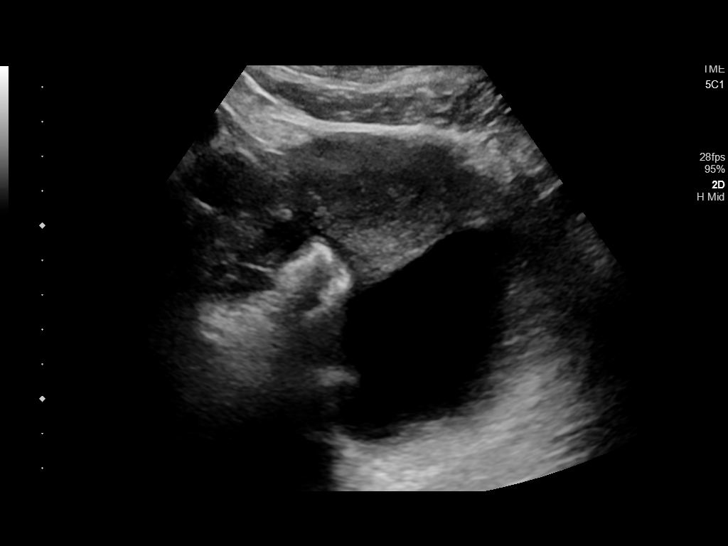
[im 21/82]
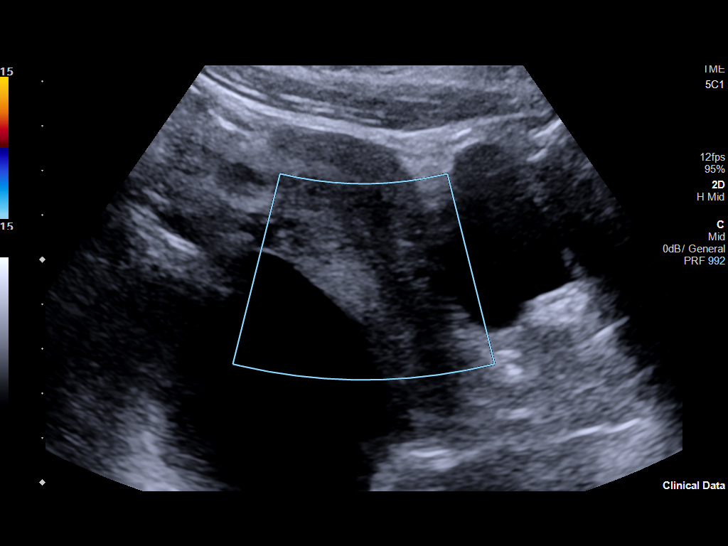
[im 28/82]
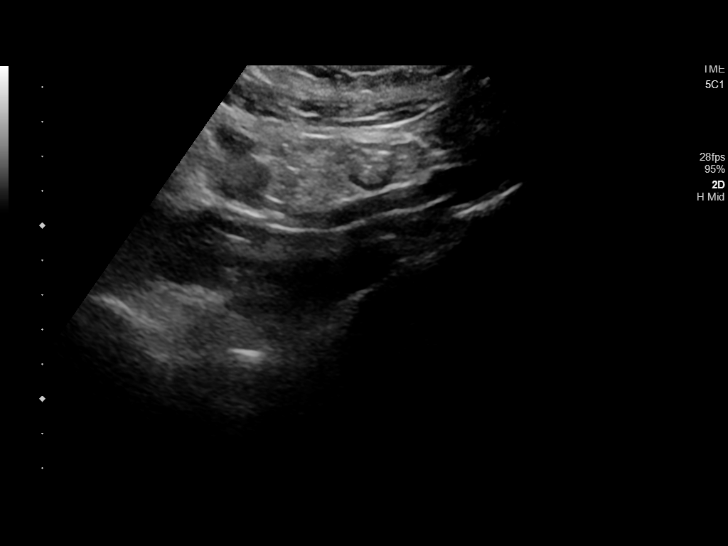
[im 34/82]
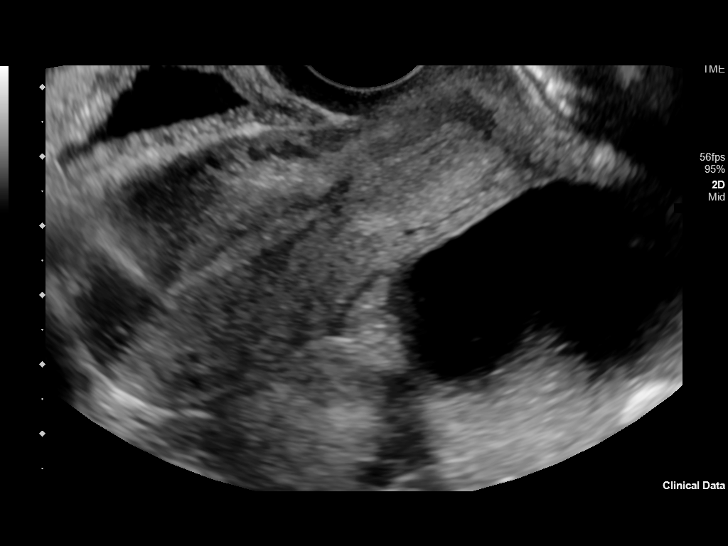
[im 41/82]
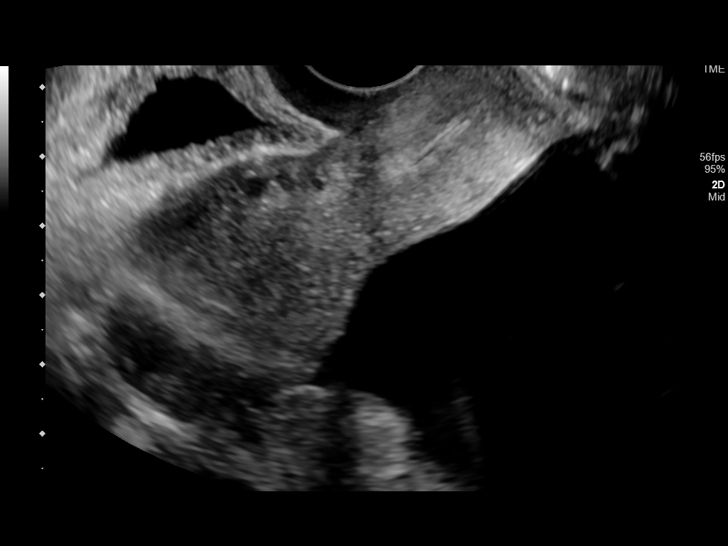
[im 48/82]
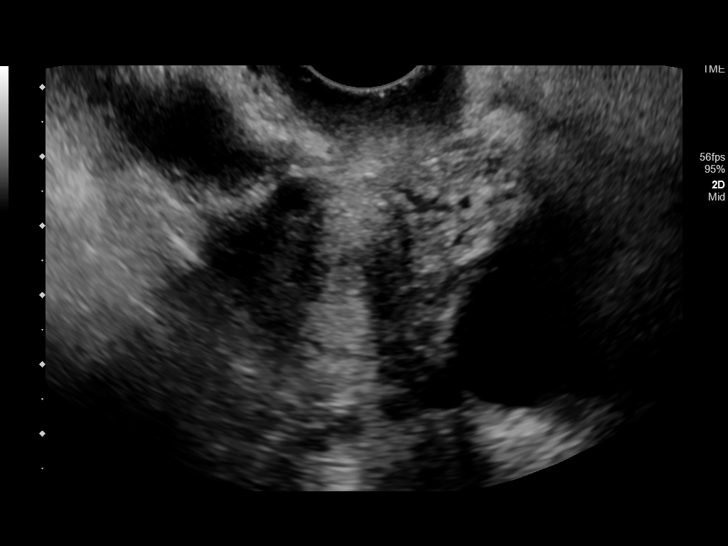
[im 55/82]
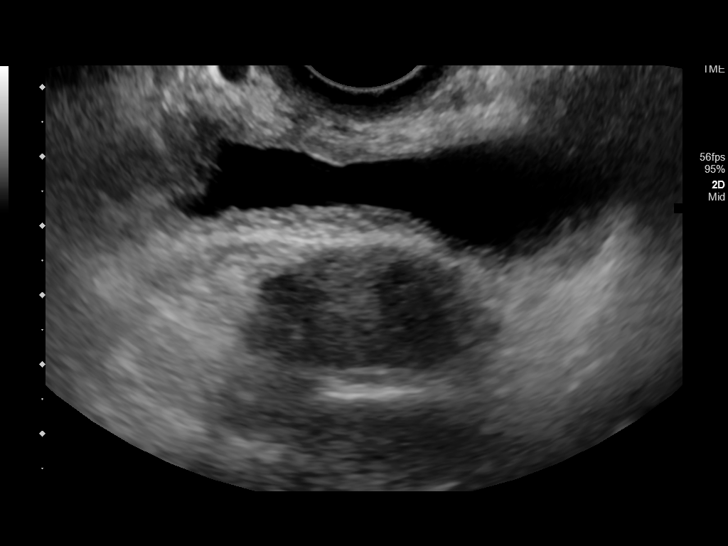
[im 61/82]
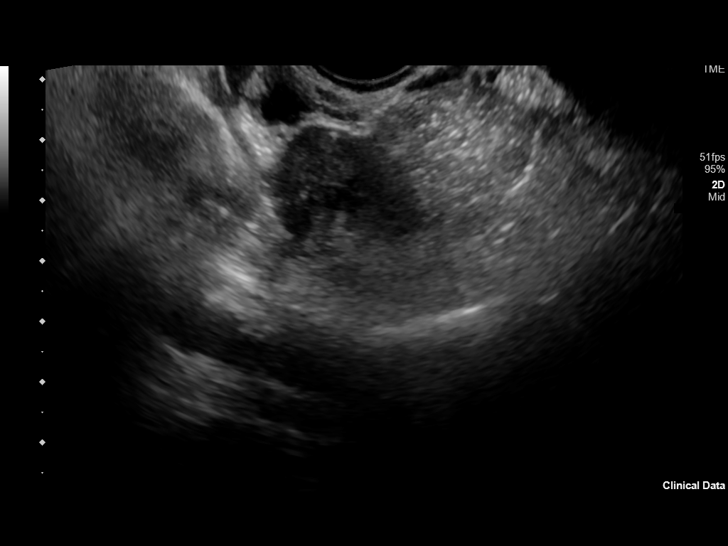
[im 68/82]
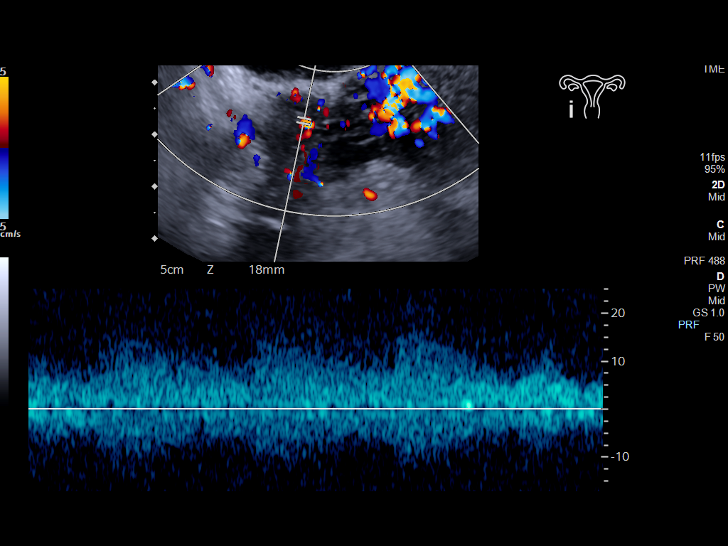
[im 75/82]
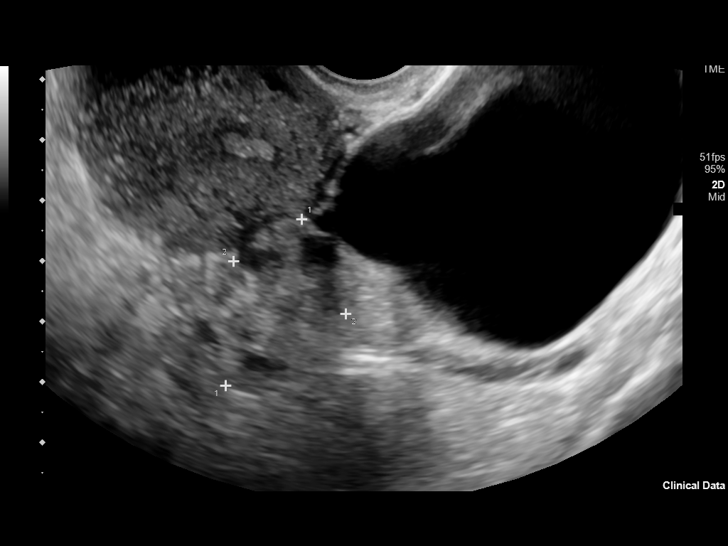
[im 82/82]
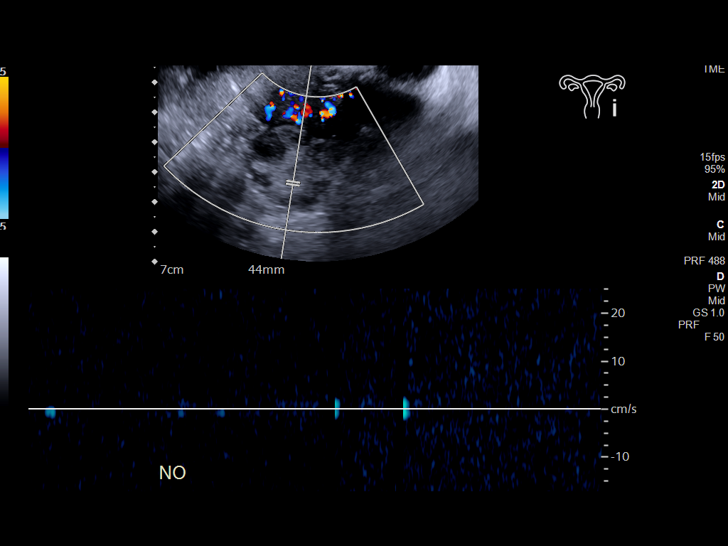

[13 of 25 positions shown; findings below may reference images not displayed]

FINDINGS: Uterus

Measurements: 7.5 x 3.5 x 3.2 cm = volume: 44 mL. Anteverted. Normal
morphology without mass.

Endometrium

Thickness: 4 mm, normal.  No endometrial fluid or focal abnormality

Right ovary

Measurements: 2.2 x 1.8 x 1.6 cm = volume: 3.3 mL. Normal morphology
without mass. Internal blood flow present on color Doppler imaging.

Left ovary

Measurements: 3.0 x 2.1 x 3.0 cm = volume: 9.7 mL. No mass lesion
identified. Heterogeneous echogenicity. Large adjacent cystic lesion
question arising from LEFT ovary versus paraovarian, 7.0 x 4.4 x
cm, containing minimal scattered internal echogenicity. No blood
flow identified within LEFT ovary on color Doppler imaging.

Pulsed Doppler evaluation of both ovaries demonstrates normal
low-resistance arterial and venous waveforms in the RIGHT ovary. No
arterial or venous waveforms are detected within the LEFT ovary.

Other findings

Small amount of free pelvic fluid.  No additional adnexal masses.
IMPRESSION: Unremarkable uterus and RIGHT ovary.

Large minimally complicated cystic lesion in LEFT adnexa adjacent to
the LEFT ovary, with visualized LEFT ovarian tissue abnormal and
heterogeneous in appearance.

No internal blood flow is seen within the LEFT ovary on color
Doppler imaging, nor arterial/waveforms detected on pulsed Doppler
imaging, consistent with LEFT ovarian torsion.

Critical Value/emergent results were called by telephone at the time
of interpretation on 06/15/2018 at 9099 hours to Dr. ROYKHAN AVON ,
who verbally acknowledged these results.

## 2021-03-16 ENCOUNTER — Other Ambulatory Visit: Payer: Self-pay | Admitting: Otolaryngology

## 2021-03-16 DIAGNOSIS — J329 Chronic sinusitis, unspecified: Secondary | ICD-10-CM

## 2021-03-23 ENCOUNTER — Ambulatory Visit
Admission: RE | Admit: 2021-03-23 | Discharge: 2021-03-23 | Disposition: A | Discharge status: Home / Self Care | Payer: BC Managed Care – PPO | Source: Ambulatory Visit | Attending: Otolaryngology | Admitting: Otolaryngology

## 2021-03-23 DIAGNOSIS — J329 Chronic sinusitis, unspecified: Secondary | ICD-10-CM

## 2023-02-19 ENCOUNTER — Ambulatory Visit
Admission: EM | Admit: 2023-02-19 | Discharge: 2023-02-19 | Disposition: A | Discharge status: Home / Self Care | Payer: BC Managed Care – PPO

## 2023-02-19 DIAGNOSIS — J45901 Unspecified asthma with (acute) exacerbation: Secondary | ICD-10-CM | POA: Diagnosis not present

## 2023-02-19 DIAGNOSIS — J209 Acute bronchitis, unspecified: Secondary | ICD-10-CM | POA: Diagnosis not present

## 2023-02-19 DIAGNOSIS — J019 Acute sinusitis, unspecified: Secondary | ICD-10-CM | POA: Diagnosis not present

## 2023-02-19 HISTORY — DX: Unspecified asthma, uncomplicated: J45.909

## 2023-02-19 LAB — POC COVID19/FLU A&B COMBO
Covid Antigen, POC: NEGATIVE
Influenza A Antigen, POC: NEGATIVE
Influenza B Antigen, POC: NEGATIVE

## 2023-02-19 MED ORDER — PROMETHAZINE-DM 6.25-15 MG/5ML PO SYRP: 5.0000 mL | ORAL_SOLUTION | Freq: Four times a day (QID) | ORAL | 0 refills | Status: AC | PRN

## 2023-02-19 NOTE — Discharge Instructions (Addendum)
As discussed to take azithromycin and prednisone were recently prescribed.  Resume use of your albuterol inhaler as needed for shortness of breath.  I have prescribed Promethazine DM which she can take up to 3 days as needed for cough.  Return as worsen or do not improve.

## 2023-02-19 NOTE — ED Provider Notes (Signed)
Renaldo Fiddler    CSN: [id number] Arrival date & time: 02/19/23  1229      History   Chief Complaint Chief Complaint  Patient presents with   Cough   Otalgia   Fever    HPI Ashlee Kelley is a 37 y.o. female.  Patient with a history of asthma presents for evaluation of cough, chest tightness, otalgia, and nasal symptoms. Patient reports today her temperature was checked and she was febrile while at school today. She reports use of inhaler after waking up which helped temporarily improve work of breathing. Patient was seen at PCP office two weeks ago with similar symptom, also had GI illness also. PCP prescribed prednisone and Azithromycin, however patient did not start medication due to the GI symptoms.  Reports that asthma symptoms are generally mild and well-controlled.  Reports she occasionally has a significant asthma exacerbation with the seasonal change from summer to fall. Past Medical History:  Diagnosis Date   Asthma     There are no problems to display for this patient.   Past Surgical History:  Procedure Laterality Date   LAPAROSCOPIC SALPINGO OOPHERECTOMY Left 06/15/2018   Procedure: LAPAROSCOPIC left SALPINgectomy, left ovarian cystectomy;  Surgeon: Christeen Douglas, MD;  Location: [facility] OR[facility];  Service: Gynecology;  Laterality: Left;    OB History   No obstetric history on file.      Home Medications    Prior to Admission medications   Medication Sig Start Date End Date Taking? Authorizing Provider  levocetirizine (XYZAL) 5 MG tablet  03/28/21  Yes [provider]  montelukast (SINGULAIR) 10 MG tablet  03/28/21  Yes [provider]  promethazine-dextromethorphan (PROMETHAZINE-DM) 6.25-15 MG/5ML syrup Take 5 mLs by mouth 4 (four) times daily as needed for cough. 02/19/23  Yes Bing Neighbors, NP  albuterol (VENTOLIN HFA) 108 (90 Base) MCG/ACT inhaler 1-2 puffs Inhalation every 4-6 hours prn cough/wheeze for 90 days    [provider]  docusate sodium (COLACE) 100 MG capsule Take 1 capsule (100 mg total) by mouth 2 (two) times daily. To keep stools soft Patient not taking: Reported on 02/19/2023 06/15/18   Christeen Douglas, MD  oxycodone (OXY-IR) 5 MG capsule Take 1 capsule (5 mg total) by mouth every 6 (six) hours as needed for pain. Patient not taking: Reported on 02/19/2023 06/15/18   Christeen Douglas, MD    Family History History reviewed. No pertinent family history.  Social History Social History   Tobacco Use   Smoking status: Never  Vaping Use   Vaping status: Never Used  Substance Use Topics   Alcohol use: Not Currently   Drug use: Never     Allergies   Other   Review of Systems Review of Systems  Constitutional:  Positive for fever.  HENT:  Positive for ear pain.   Respiratory:  Positive for cough.      Physical Exam Triage Vital Signs ED Triage Vitals  Encounter Vitals Group     BP 02/19/23 1323 109/73     Systolic BP Percentile --      Diastolic BP Percentile --      Pulse Rate 02/19/23 1323 93     Resp 02/19/23 1323 16     Temp 02/19/23 1323 98.7 F (37.1 C)     Temp src --      SpO2 02/19/23 1323 98 %     Weight --      Height --      Head Circumference --  Peak Flow --      Pain Score 02/19/23 1316 4     Pain Loc --      Pain Education --      Exclude from Growth Chart --    No data found.  Updated Vital Signs BP 109/73   Pulse 93   Temp 98.7 F (37.1 C)   Resp 16   LMP 01/27/2023   SpO2 98%   Visual Acuity Right Eye Distance:   Left Eye Distance:   Bilateral Distance:    Right Eye Near:   Left Eye Near:    Bilateral Near:     Physical Exam Vitals reviewed.  Constitutional:      Appearance: Normal appearance.  HENT:     Head: Normocephalic and atraumatic.     Right Ear: Hearing, tympanic membrane, ear canal and external ear normal.     Left Ear: A middle ear effusion is present.     Nose: Rhinorrhea present.  Eyes:      Extraocular Movements: Extraocular movements intact.     Pupils: Pupils are equal, round, and reactive to light.  Cardiovascular:     Rate and Rhythm: Normal rate and regular rhythm.  Pulmonary:     Effort: Pulmonary effort is normal.     Breath sounds: Decreased air movement present. Rhonchi present.  Chest:     Chest wall: Tenderness present.  Musculoskeletal:        General: Normal range of motion.     Cervical back: Normal range of motion and neck supple.  Lymphadenopathy:     Cervical: No cervical adenopathy.  Skin:    General: Skin is warm and dry.  Neurological:     General: No focal deficit present.     Mental Status: She is alert.      UC Treatments / Results  Labs (all labs ordered are listed, but only abnormal results are displayed) Labs Reviewed  POC COVID19/FLU A&B COMBO    EKG   Radiology No results found.  Procedures Procedures (including critical care time)  Medications Ordered in UC Medications - No data to display  Initial Impression / Assessment and Plan / UC Course  I have reviewed the triage vital signs and the nursing notes.  Pertinent labs & imaging results that were available during my care of the patient were reviewed by me and considered in my medical decision making (see chart for details).    Acute asthma exacerbation with acute bronchitis, start previously prescribed prednisone and resume use of albuterol inhaler 2 puffs every 4-6 hours.  For acute sinusitis resume azithromycin previously prescribed take as directed.  Promethazine DM for cough symptoms and this will help with nasal symptoms as well.  Return precautions given if symptoms worsen or do not improve.  Final Clinical Impressions(s) / UC Diagnoses   Final diagnoses:  Asthma with acute exacerbation, unspecified asthma severity, unspecified whether persistent  Acute bronchitis, unspecified organism  Acute non-recurrent sinusitis, unspecified location     Discharge  Instructions      As discussed to take azithromycin and prednisone were recently prescribed.  Resume use of your albuterol inhaler as needed for shortness of breath.  I have prescribed Promethazine DM which she can take up to 3 days as needed for cough.  Return as worsen or do not improve.      ED Prescriptions     Medication Sig Dispense Auth. Provider   promethazine-dextromethorphan (PROMETHAZINE-DM) 6.25-15 MG/5ML syrup Take 5 mLs by mouth 4 (  four) times daily as needed for cough. 180 mL Bing Neighbors, NP      PDMP not reviewed this encounter.   Bing Neighbors, NP 02/19/23 [phone]

## 2023-02-19 NOTE — ED Triage Notes (Signed)
Patient to Urgent Care with complaints of chest congestion/ productive cough/ ear pain/ fevers (100.3)/ body aches.  Reports symptoms started this morning. Has bene coughing for a 2-3 weeks.   Hx of asthma. Reports waking up with tightness in her chest this morning. Used her albuterol inhaler w/ some improvement.
